# Patient Record
Sex: Female | Born: 1980 | Race: Black or African American | Hispanic: No | Marital: Married | Smoking: Never smoker
Health system: Southern US, Community
[De-identification: ages and names within clinical notes are randomized; demographics above are authoritative.]

## PROBLEM LIST (undated history)

## (undated) DIAGNOSIS — I10 Essential (primary) hypertension: Secondary | ICD-10-CM

---

## 2005-11-09 ENCOUNTER — Emergency Department: Payer: Self-pay | Admitting: Unknown Physician Specialty

## 2005-11-09 ENCOUNTER — Other Ambulatory Visit: Payer: Self-pay

## 2007-07-23 ENCOUNTER — Emergency Department: Payer: Self-pay | Admitting: Emergency Medicine

## 2007-08-06 IMAGING — CT CT CHEST W/ CM
3 of 4 series · 17 of 29 positions shown, 19 images · IV contrast (APPLIED)
Comparison: none

REASON FOR EXAM: Chest pain
COMMENTS:

[Series 5: soft tissue · axial · 0.74mm/px · z∈[+94,+210]mm · 7 of 55 slices shown (1 of 2)]
[im 8/55  mediastinal]
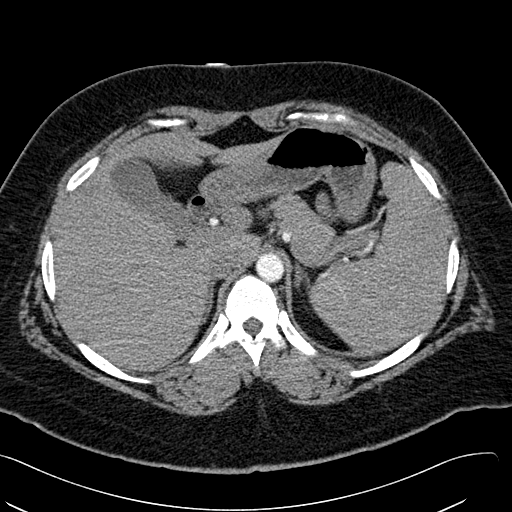
[im 16/55  mediastinal]
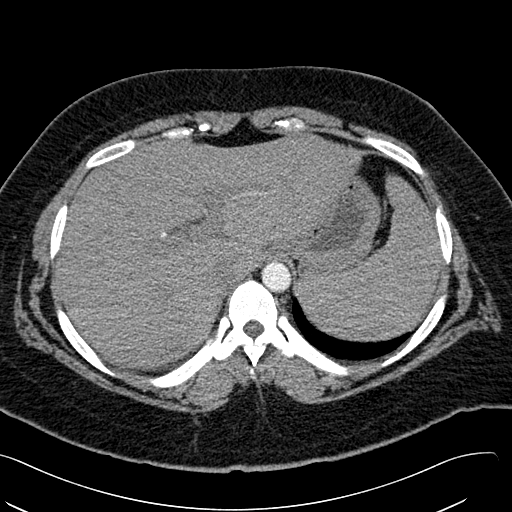
[im 24/55  mediastinal]
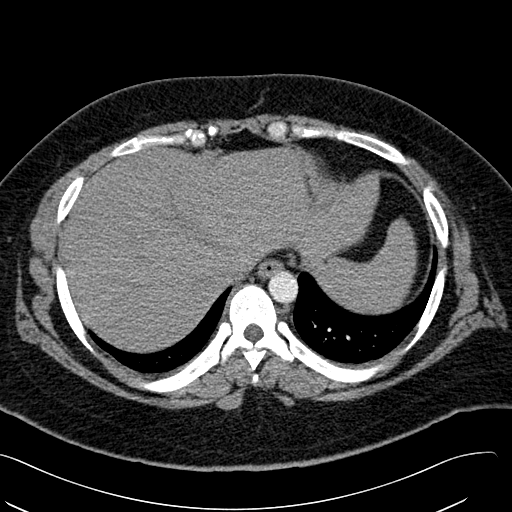
[im 31/55  mediastinal]
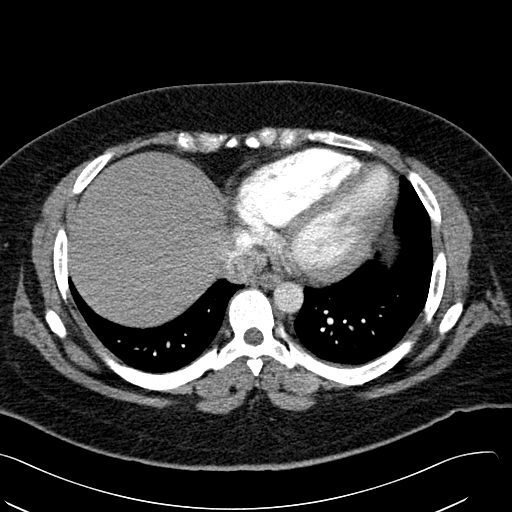
[im 39/55  mediastinal]
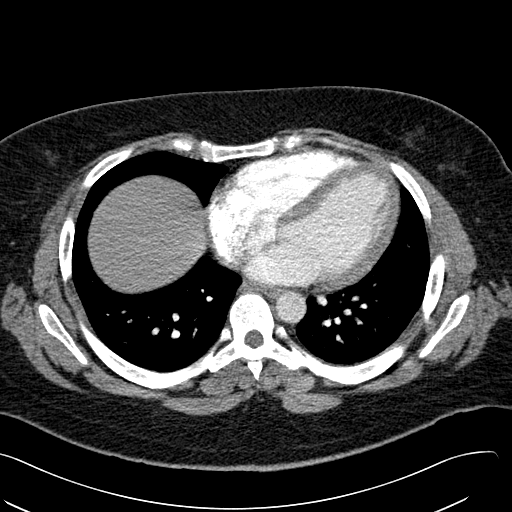
[im 40/55  mediastinal]
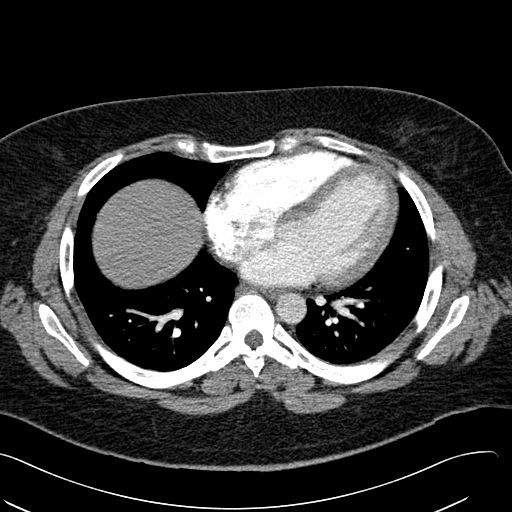
[im 47/55  mediastinal]
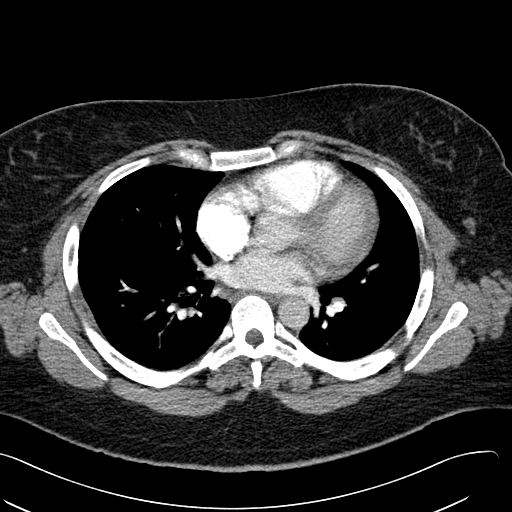

[Series 6: lung windows · axial · 0.74mm/px · z∈[+100,+210]mm · 6 of 53 slices shown, 8 images]
[im 8/53  mediastinal]
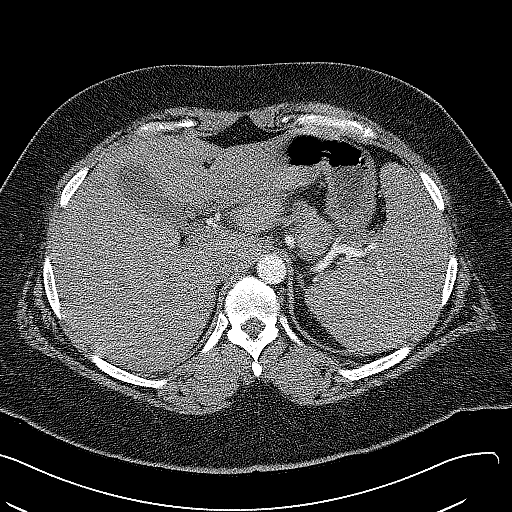
[im 8/53  lung]
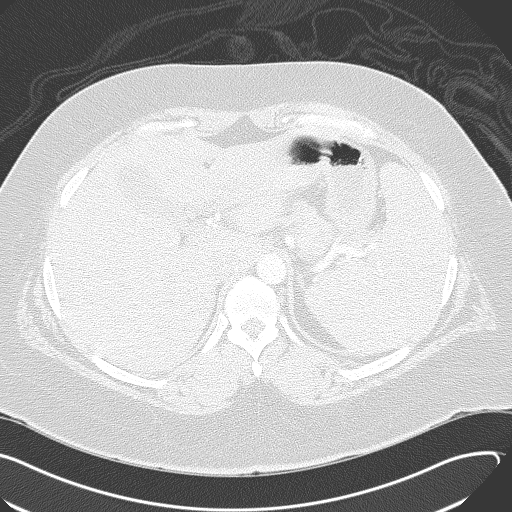
[im 15/53  lung]
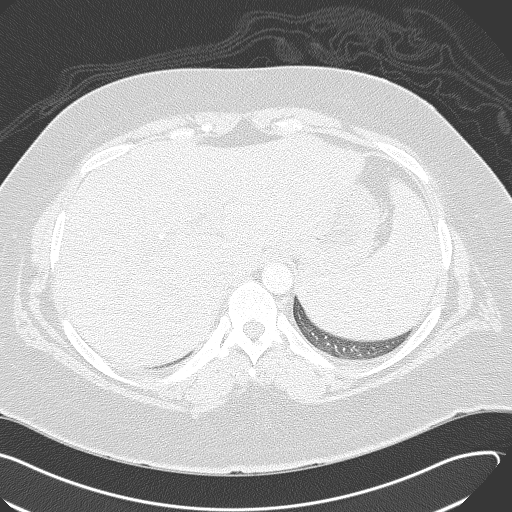
[im 23/53  lung]
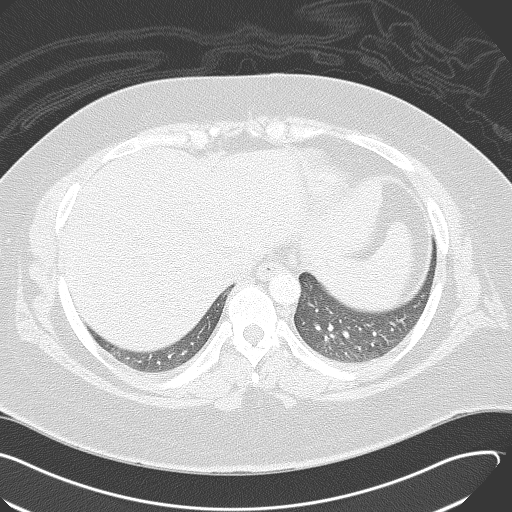
[im 30/53  lung]
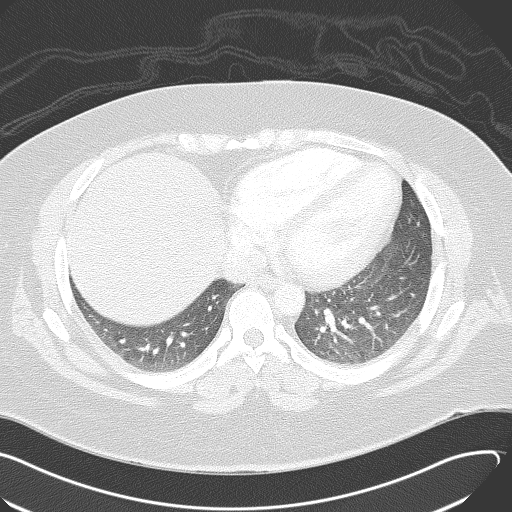
[im 38/53  mediastinal]
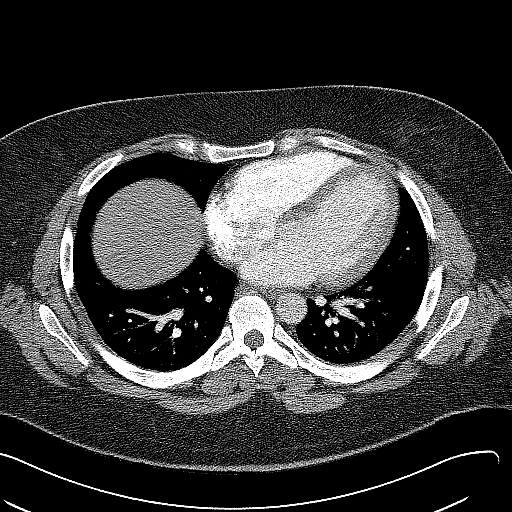
[im 38/53  lung]
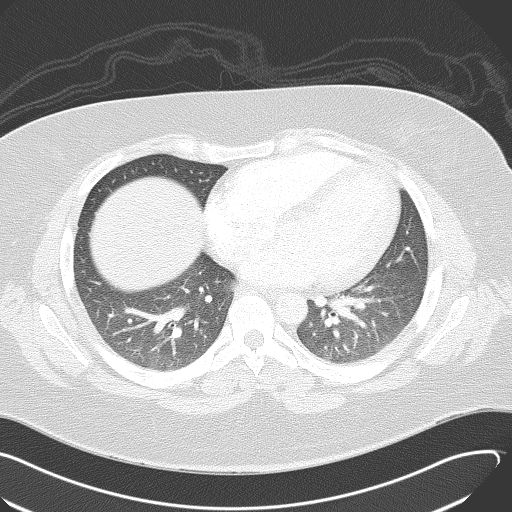
[im 45/53  lung]
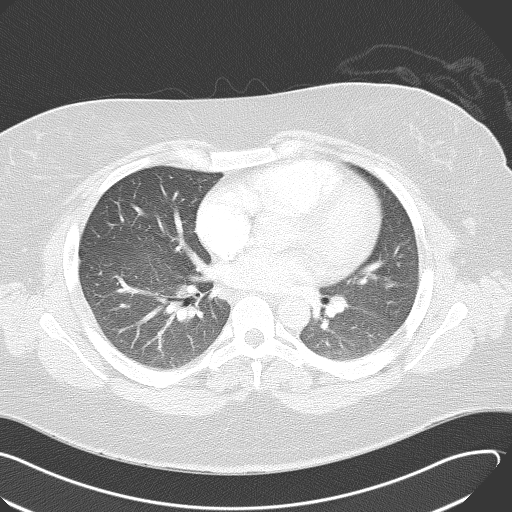

[Series 10: soft tissue · axial · 0.74mm/px · z∈[+233,+302]mm · 4 of 39 slices shown (2 of 2)]
[im 8/39  mediastinal]
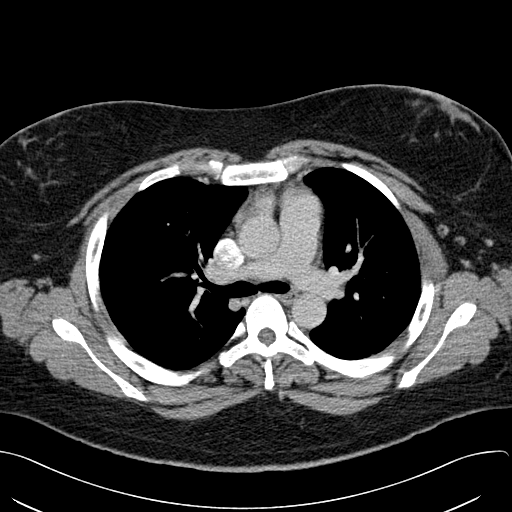
[im 16/39  mediastinal]
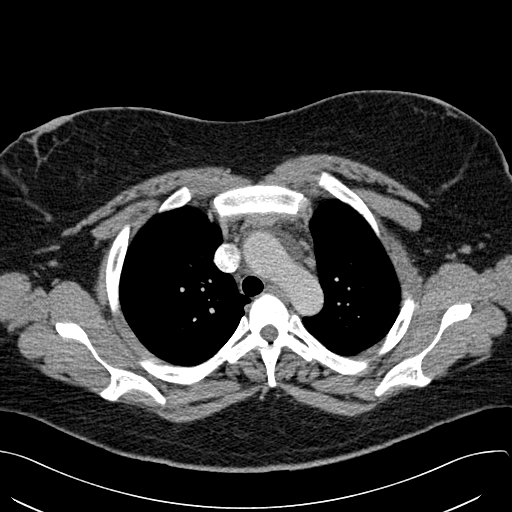
[im 23/39  mediastinal]
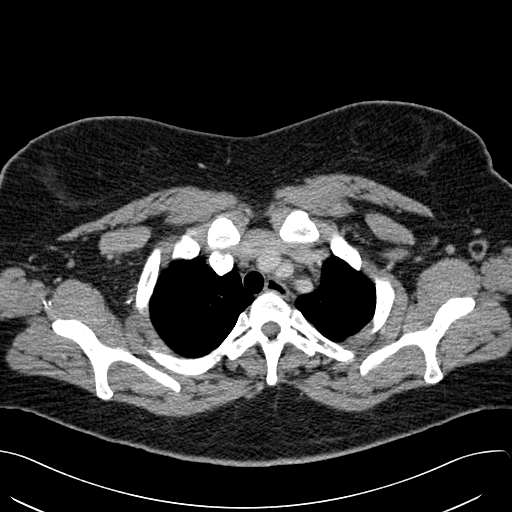
[im 31/39  mediastinal]
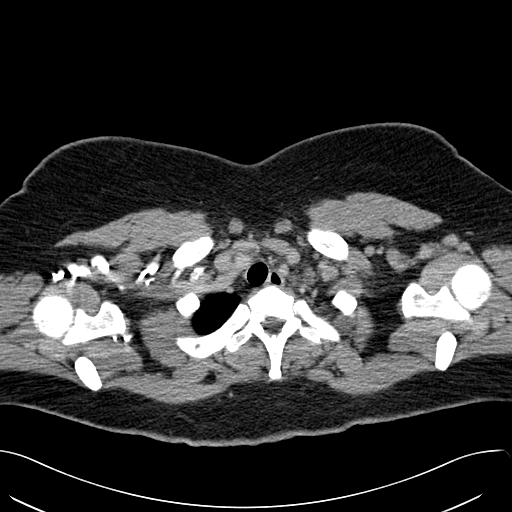

[17 of 29 positions shown; findings below may reference images not displayed]

PROCEDURE:     CT  - CT CHEST (FOR PE) W  - November 09, 2005  [DATE]

RESULT:     The patient is complaining of chest discomfort, predominantly
LEFT sided.

Two injections of contrast were required to get an adequate contrast bolus.
The patient received a total of 175 cc's.

Contrast within the pulmonary arterial tree is normal in appearance. I do
not see findings suggestive of an acute pulmonary embolism. There are a few,
borderline enlarged, RIGHT hilar lymph nodes present. The cardiac chambers
are not enlarged. The caliber of the thoracic aorta is normal. I see no
bulky mediastinal lymphadenopathy. The cardiac chambers are noted to be at
the upper limits of normal for size. There is no pleural or pericardial
effusion. At lung window settings, I see no abnormal nodules or interstitial
or alveolar infiltrates. Within the upper abdomen, the observed portions of
the liver are normal. The spleen is prominent but only partially imaged.
IMPRESSION: 1.     I do not see objective evidence of central pulmonary emboli. More
peripherally, the contrast bolus was limited due to the scanning technique
and the patient's body habitus.
2.     I do not see evidence of bulky mediastinal or hilar lymph nodes
though there are small, borderline enlarged, RIGHT hilar nodes present.
3.     The cardiac chambers are top normal in size.
4.     If the patient's symptoms persist, follow-up scanning either with CT
or with Ventilation/Perfusion Lung Scan is available upon request.

A preliminary report was sent to the Emergency Room at the conclusion of the
study.

## 2018-10-24 ENCOUNTER — Other Ambulatory Visit: Payer: Self-pay

## 2018-10-24 ENCOUNTER — Ambulatory Visit
Admission: EM | Admit: 2018-10-24 | Discharge: 2018-10-24 | Disposition: A | Discharge status: Home / Self Care | Payer: Self-pay | Attending: Emergency Medicine | Admitting: Emergency Medicine

## 2018-10-24 ENCOUNTER — Encounter: Payer: Self-pay | Admitting: Emergency Medicine

## 2018-10-24 DIAGNOSIS — G44209 Tension-type headache, unspecified, not intractable: Secondary | ICD-10-CM

## 2018-10-24 DIAGNOSIS — R5383 Other fatigue: Secondary | ICD-10-CM

## 2018-10-24 DIAGNOSIS — R519 Headache, unspecified: Secondary | ICD-10-CM

## 2018-10-24 HISTORY — DX: Essential (primary) hypertension: I10

## 2018-10-24 LAB — COMPREHENSIVE METABOLIC PANEL
ALT: 17 U/L (ref 0–44)
AST: 22 U/L (ref 15–41)
Albumin: 3.8 g/dL (ref 3.5–5.0)
Alkaline Phosphatase: 51 U/L (ref 38–126)
Anion gap: 8 (ref 5–15)
BUN: 10 mg/dL (ref 6–20)
CO2: 26 mmol/L (ref 22–32)
Calcium: 8.8 mg/dL — ABNORMAL LOW (ref 8.9–10.3)
Chloride: 101 mmol/L (ref 98–111)
Creatinine, Ser: 0.8 mg/dL (ref 0.44–1.00)
GFR calc Af Amer: >60 mL/min (ref >60)
GFR calc non Af Amer: >60 mL/min (ref >60)
Glucose, Bld: 97 mg/dL (ref 70–99)
Potassium: 4.3 mmol/L (ref 3.5–5.1)
Sodium: 135 mmol/L (ref 135–145)
Total Bilirubin: 1.1 mg/dL (ref 0.3–1.2)
Total Protein: 7.4 g/dL (ref 6.5–8.1)

## 2018-10-24 LAB — CBC WITH DIFFERENTIAL/PLATELET
Abs Immature Granulocytes: 0.01 10*3/uL (ref 0.00–0.07)
Basophils Absolute: 0 10*3/uL (ref 0.0–0.1)
Basophils Relative: 1 %
Eosinophils Absolute: 0.1 10*3/uL (ref 0.0–0.5)
Eosinophils Relative: 3 %
HCT: 33.1 % — ABNORMAL LOW (ref 36.0–46.0)
Hemoglobin: 10.2 g/dL — ABNORMAL LOW (ref 12.0–15.0)
Immature Granulocytes: 0 %
Lymphocytes Relative: 37 %
Lymphs Abs: 1.3 10*3/uL (ref 0.7–4.0)
MCH: 25.9 pg — ABNORMAL LOW (ref 26.0–34.0)
MCHC: 30.8 g/dL (ref 30.0–36.0)
MCV: 84 fL (ref 80.0–100.0)
Monocytes Absolute: 0.3 10*3/uL (ref 0.1–1.0)
Monocytes Relative: 8 %
Neutro Abs: 1.8 10*3/uL (ref 1.7–7.7)
Neutrophils Relative %: 51 %
Platelets: 159 10*3/uL (ref 150–400)
RBC: 3.94 MIL/uL (ref 3.87–5.11)
RDW: 14.6 % (ref 11.5–15.5)
WBC: 3.5 10*3/uL — ABNORMAL LOW (ref 4.0–10.5)
nRBC: 0 % (ref 0.0–0.2)

## 2018-10-24 LAB — TSH: TSH: 1.804 u[IU]/mL (ref 0.350–4.500)

## 2018-10-24 MED ORDER — KETOROLAC TROMETHAMINE 60 MG/2ML IM SOLN
30.0000 mg | Freq: Once | INTRAMUSCULAR | Status: AC
  Administered 2018-10-24: 30 mg via INTRAMUSCULAR

## 2018-10-24 NOTE — ED Provider Notes (Signed)
[facility]    CSN: [id number] Arrival date & time: 10/24/18  1113     History   Chief Complaint Chief Complaint  Patient presents with  . Fatigue    HPI Jaime Noble is a 38 y.o. female.   Jaime Noble presents with complaints of worsening and persistent fatigue for the past 2 months. States she feels like she wants to sleep for hours. Recurrent headaches and migraines for the past 3 weeks. Today headache is tolerable, dull, 5/10 in severity. Has taken ibuprofen, tylenol and BC in the past which does help some. States she feels generalized body aches at times. Today has a sore throat. No cough. No fevers. No vision changes. Occasional dizziness, hx of vertigo, which has felt similar. Has noticed increased thirst, no obvious increased urination. Doesn't no neck pain. No cough or congestion. No known ill contacts. Denies  Blood or black in stools. No blood in urine. Periods are regular, last was two weeks ago. Denies any increased or heavy flow. Has been taking multivitamins as supplements. States she has lost what sounds to be approximately 150lbs, purposefully. She is now vegan- doesn't eat meat dairy or soy. States that her diet is very limited. She counts calories and eats typically about 800 calories a day, sometimes 1200 a day. She states she has a history of anemia in the past. She states she doesn't eat meat, nuts or greens any more. States lately she eats squash and pita bread. Her father passed away in March of this year which has been sad, but denies any other obvious sensation of increased anxiety or depression. Doesn't have a PCP. Hx of htn. Is not on any medications for this. No previous hx of OSA per chart review.      ROS per HPI, negative if not otherwise mentioned.      Past Medical History:  Diagnosis Date  . Hypertension     There are no active problems to display for this patient.   Past Surgical History:  Procedure Laterality Date  .  CESAREAN SECTION      OB History   No obstetric history on file.      Home Medications    Prior to Admission medications   Not on File    Family History Family History  Problem Relation Age of Onset  . Hypertension Mother   . Hypertension Father     Social History Social History   Tobacco Use  . Smoking status: Never Smoker  . Smokeless tobacco: Never Used  Substance Use Topics  . Alcohol use: Yes  . Drug use: Never     Allergies   Patient has no known allergies.   Review of Systems Review of Systems   Physical Exam Triage Vital Signs ED Triage Vitals  Enc Vitals Group     BP 10/24/18 1133 (!) 148/111     Pulse Rate 10/24/18 1133 67     Resp 10/24/18 1133 18     Temp 10/24/18 1133 98.1 F (36.7 C)     Temp Source 10/24/18 1133 Oral     SpO2 10/24/18 1133 100 %     Weight 10/24/18 1136 150 lb (68 kg)     Height 10/24/18 1136 5\' 7"  (1.702 m)     Head Circumference --      Peak Flow --      Pain Score 10/24/18 1136 6     Pain Loc --      Pain Edu? --  Excl. in [place]? --    No data found.  Updated Vital Signs BP (!) 155/104 (BP Location: Left Arm)   Pulse 67   Temp 98.1 F (36.7 C) (Oral)   Resp 18   Ht 5\' 7"  (1.702 m)   Wt 150 lb (68 kg)   LMP 10/10/2018   SpO2 100%   BMI 23.49 kg/m    Physical Exam Constitutional:      General: She is not in acute distress.    Appearance: She is well-developed.  HENT:     Head: Normocephalic and atraumatic.     Mouth/Throat:     Mouth: Mucous membranes are moist.  Eyes:     Pupils: Pupils are equal, round, and reactive to light.  Cardiovascular:     Rate and Rhythm: Normal rate and regular rhythm.     Heart sounds: Normal heart sounds.  Pulmonary:     Effort: Pulmonary effort is normal.     Breath sounds: Normal breath sounds.  Musculoskeletal: Normal range of motion.        General: No tenderness.     Right lower leg: No edema.     Left lower leg: No edema.  Skin:    General: Skin is  warm and dry.  Neurological:     Mental Status: She is alert and oriented to person, place, and time.  Psychiatric:     Comments: Tearful in discussing diet      UC Treatments / Results  Labs (all labs ordered are listed, but only abnormal results are displayed) Labs Reviewed  COMPREHENSIVE METABOLIC PANEL - Abnormal; Notable for the following components:      Result Value   Calcium 8.8 (*)    All other components within normal limits  CBC WITH DIFFERENTIAL/PLATELET - Abnormal; Notable for the following components:   WBC 3.5 (*)    Hemoglobin 10.2 (*)    HCT 33.1 (*)    MCH 25.9 (*)    All other components within normal limits  TSH    EKG None  Radiology No results found.  Procedures Procedures (including critical care time)  Medications Ordered in UC Medications  ketorolac (TORADOL) injection 30 mg (30 mg Intramuscular Given 10/24/18 1247)    Initial Impression / Assessment and Plan / UC Course  I have reviewed the triage vital signs and the nursing notes.  Pertinent labs & imaging results that were available during my care of the patient were reviewed by me and considered in my medical decision making (see chart for details).     Patient expresses quite poor diet with very limited protein or iron. Discussed this at length with patient, discussing that this very easily can be the source of her fatigue and even headaches. Basic labs collected here today, CBC with anemia present, very likely dietary and iron deficiency. Calcium is low as well. toradol provided for headache here today. Recommended close follow up with PCP as may need further work up and management. Discussed the role of nutritionist and likely even a counselor/therapist as it does sound that there are psychological barriers to diet and nutrition. TSH pending.  Final Clinical Impressions(s) / UC Diagnoses   Final diagnoses:  Fatigue, unspecified type  Nonintractable headache, unspecified chronicity  pattern, unspecified headache type     Discharge Instructions     See information about iron in the diet to try to start increasing.  Will notify you of any positive findings from your labs and if any changes to  treatment are needed. You may monitor your results on your MyChart online as well.   Please follow up with your primary care provider for persistent symptoms as you may need additional testing or workup completed.     ED Prescriptions    None     Controlled Substance Prescriptions [place] Controlled Substance Registry consulted? Not Applicable   Georgetta HaberBurky,  B, NP 10/24/18 [phone]

## 2018-10-24 NOTE — Discharge Instructions (Signed)
See information about iron in the diet to try to start increasing.  Will notify you of any positive findings from your labs and if any changes to treatment are needed. You may monitor your results on your MyChart online as well.   Please follow up with your primary care provider for persistent symptoms as you may need additional testing or workup completed.

## 2018-10-24 NOTE — ED Triage Notes (Signed)
Patient states she has been feeling more and more fatigued, recurrent headaches and now body aches

## 2018-10-27 ENCOUNTER — Telehealth (HOSPITAL_COMMUNITY): Payer: Self-pay | Admitting: Emergency Medicine

## 2018-10-27 NOTE — Telephone Encounter (Signed)
Attempted to reach patient. Neither number is working

## 2019-04-01 ENCOUNTER — Encounter: Payer: Self-pay | Admitting: Emergency Medicine

## 2019-04-01 ENCOUNTER — Other Ambulatory Visit: Payer: Self-pay

## 2019-04-01 ENCOUNTER — Ambulatory Visit
Admission: EM | Admit: 2019-04-01 | Discharge: 2019-04-01 | Disposition: A | Discharge status: Home / Self Care | Payer: 59 | Attending: Emergency Medicine | Admitting: Emergency Medicine

## 2019-04-01 DIAGNOSIS — J0100 Acute maxillary sinusitis, unspecified: Secondary | ICD-10-CM

## 2019-04-01 DIAGNOSIS — Z7189 Other specified counseling: Secondary | ICD-10-CM

## 2019-04-01 MED ORDER — AMOXICILLIN-POT CLAVULANATE 875-125 MG PO TABS: 1.0000 | ORAL_TABLET | Freq: Two times a day (BID) | ORAL | 0 refills | Status: AC

## 2019-04-01 NOTE — Discharge Instructions (Signed)
Take medication as prescribed. Rest. Drink plenty of fluids. Continue over-the-counter medication as needed.  Follow up with your primary care physician this week as needed. Return to Urgent care or ER for new or worsening concerns.

## 2019-04-01 NOTE — ED Provider Notes (Signed)
[facility] ____________________________________________  Time seen: Approximately 4:33 PM  I have reviewed the triage vital signs and the nursing notes.   HISTORY  Chief Complaint Headache (APPT) and Sinus Problem   HPI Jaime Noble is a 38 y.o. female presenting for evaluation of 2 to 3 days of sinus pain, sinus congestion, drainage and body aches.  Patient reports for the last 2 weeks she has been having intermittent sinus congestion, postnasal drainage and headache.  States last 2 days her headache and frontal sinus pressure has increased.  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  Has tried multiple over-the-counter cough congestion agents which helped but no resolution.  States feels similar to previous sinus infections.  Denies known fevers.  Overall continues to eat and drink well.  Denies chest pain, shortness of breath, vomiting, diarrhea.  States taste and smell has had some mild changes but overall able to still taste and smell.  Patient's last menstrual period was 03/15/2019 (approximate). Denies pregnancy.    Past Medical History:  Diagnosis Date   Hypertension     There are no active problems to display for this patient.   Past Surgical History:  Procedure Laterality Date   CESAREAN SECTION       No current facility-administered medications for this encounter.   Current Outpatient Medications:    amoxicillin-clavulanate (AUGMENTIN) 875-125 MG tablet, Take 1 tablet by mouth every 12 (twelve) hours., Disp: 20 tablet, Rfl: 0  Allergies Patient has no known allergies.  Family History  Problem Relation Age of Onset   Hypertension Mother    Hypertension Father     Social History Social History   Tobacco Use   Smoking status: Never Smoker   Smokeless tobacco: Never Used  Substance Use Topics   Alcohol use: Yes   Drug use: Never    Review of  Systems Constitutional: No fever ENT: No sore throat.  Positive congestion. Cardiovascular: Denies chest pain. Respiratory: Denies shortness of breath. Gastrointestinal: No abdominal pain.  No nausea, no vomiting.  No diarrhea.   Genitourinary: Negative for dysuria. Musculoskeletal: Negative for back pain. Skin: Negative for rash. Neurological: Negative for focal weakness or numbness.  Positive headaches.    ____________________________________________   PHYSICAL EXAM:  VITAL SIGNS: ED Triage Vitals  Enc Vitals Group     BP 04/01/19 1542 (!) 149/107     Pulse Rate 04/01/19 1542 83     Resp 04/01/19 1542 18     Temp 04/01/19 1542 98.9 F (37.2 C)     Temp Source 04/01/19 1542 Oral     SpO2 04/01/19 1542 97 %     Weight 04/01/19 1538 180 lb (81.6 kg)     Height 04/01/19 1538 5\' 7"  (1.702 m)     Head Circumference --      Peak Flow --      Pain Score 04/01/19 1538 8     Pain Loc --      Pain Edu? --      Excl. in [place]? --     Constitutional: Alert and oriented.  Eyes: Conjunctivae are normal. Head: Atraumatic.Mild to moderate tenderness to palpation bilateral frontal and moderate tenderness palpation bilateral maxillary sinuses. No swelling. No erythema.   Ears: no erythema, normal TMs bilaterally.   Nose: nasal congestion   Mouth/Throat: Mucous membranes are moist.  Oropharynx non-erythematous.No tonsillar swelling or exudate.  Neck: No stridor.  No  cervical spine tenderness to palpation. Hematological/Lymphatic/Immunilogical: No cervical lymphadenopathy. Cardiovascular: Normal rate, regular rhythm. Grossly normal heart sounds.  Good peripheral circulation. Respiratory: Normal respiratory effort.  No retractions. No wheezes, rales or rhonchi. Good air movement.  Gastrointestinal: Soft and nontender. No distention. Normal Bowel sounds. No CVA tenderness. Musculoskeletal: Steady gait. Neurologic:  Normal speech and language. No gross focal neurologic deficits are  appreciated. No gait instability.  No meningismus. Skin:  Skin is warm, dry and intact. No rash noted. Psychiatric: Mood and affect are normal. Speech and behavior are normal.  ___________________________________________   LABS (all labs ordered are listed, but only abnormal results are displayed)  Labs Reviewed  NOVEL CORONAVIRUS, NAA (HOSP ORDER, SEND-OUT TO REF LAB; TAT 18-24 HRS)    PROCEDURES Procedures    INITIAL IMPRESSION / ASSESSMENT AND PLAN / ED COURSE  Pertinent labs & imaging results that were available during my care of the patient were reviewed by me and considered in my medical decision making (see chart for details).  Suspect recent viral illness, however as symptoms have consistent with sinus pressure and drainage, suspect secondary sinusitis.  Will treat with oral Augmentin.  COVID-19 testing completed and advice given.  Continue over-the-counter supportive care, rest, fluids and supportive care.  Discussed her follow-up and return parameters. Discussed indication, risks and benefits of medications with patient.   Discussed follow up with Primary care physician this week as needed. Discussed follow up and return parameters including no resolution or any worsening concerns. Patient verbalized understanding and agreed to plan.   ____________________________________________   FINAL CLINICAL IMPRESSION(S) / ED DIAGNOSES  Final diagnoses:  Acute maxillary sinusitis, recurrence not specified  Advice given about COVID-19 virus infection     ED Discharge Orders         Ordered    amoxicillin-clavulanate (AUGMENTIN) 875-125 MG tablet  Every 12 hours     04/01/19 1635           Note: This dictation was prepared with Dragon dictation along with smaller phrase technology. Any transcriptional errors that result from this process are unintentional.         Renford Dills, NP 04/01/19 1734

## 2019-04-01 NOTE — ED Triage Notes (Signed)
Pt c/o headache, sinus pain and pressure, runny nose, body aches. Started about 2 days ago. 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

## 2019-04-02 LAB — NOVEL CORONAVIRUS, NAA (HOSP ORDER, SEND-OUT TO REF LAB; TAT 18-24 HRS): SARS-CoV-2, NAA: NOT DETECTED
